# Patient Record
Sex: Female | Born: 2000 | Race: Black or African American | Hispanic: No | Marital: Single | Smoking: Never smoker
Health system: Southern US, Community
[De-identification: ages and names within clinical notes are randomized; demographics above are authoritative.]

## PROBLEM LIST (undated history)

## (undated) DIAGNOSIS — I639 Cerebral infarction, unspecified: Secondary | ICD-10-CM

## (undated) DIAGNOSIS — E7800 Pure hypercholesterolemia, unspecified: Secondary | ICD-10-CM

## (undated) HISTORY — DX: Cerebral infarction, unspecified: I63.9

## (undated) HISTORY — DX: Pure hypercholesterolemia, unspecified: E78.00

---
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
## 2021-04-04 ENCOUNTER — Other Ambulatory Visit: Payer: Self-pay

## 2021-04-04 ENCOUNTER — Ambulatory Visit
Admission: EM | Admit: 2021-04-04 | Discharge: 2021-04-04 | Disposition: A | Discharge status: Home / Self Care | Payer: BC Managed Care – PPO

## 2021-04-04 ENCOUNTER — Encounter: Payer: Self-pay | Admitting: Emergency Medicine

## 2021-04-04 DIAGNOSIS — K5900 Constipation, unspecified: Secondary | ICD-10-CM

## 2021-04-04 DIAGNOSIS — R1084 Generalized abdominal pain: Secondary | ICD-10-CM

## 2021-04-04 DIAGNOSIS — K921 Melena: Secondary | ICD-10-CM

## 2021-04-04 MED ORDER — DOCUSATE SODIUM 100 MG PO CAPS: 100.0000 mg | ORAL_CAPSULE | Freq: Two times a day (BID) | ORAL | 0 refills | Status: DC

## 2021-04-04 MED ORDER — POLYETHYLENE GLYCOL 3350 17 G PO PACK: 17.0000 g | PACK | Freq: Every day | ORAL | 0 refills | Status: DC | PRN

## 2021-04-04 MED ORDER — FLEET ENEMA 7-19 GM/118ML RE ENEM: 1.0000 | ENEMA | Freq: Every day | RECTAL | 5 refills | Status: DC | PRN

## 2021-04-04 NOTE — ED Provider Notes (Signed)
[facility]   MRN: [id number] DOB: Oct 05, 2000  Subjective:   Melanie Bowman is a 20 y.o. female presenting for 5 day history of constant mild-moderate bilateral flank/abdominal pain. Characterizes her pain as sharp, pressure type sensation. Feels that her pain is "heavy". She has had some intermittent nausea after eating. Reports chronic constipation, intermittent light blood in her stools. Had difficulty with this as a child but recently has come back. She tried a laxative Friday and had a bowel movement but has not had one again since then.  Patient's mother was very concerned because just after taking the laxative she had severe abdominal pain that ended up resolving after she had a bowel movement.  Denies fever, dysuria, hematuria, urinary frequency, vomiting. Patient does not have concerns for pregnancy, reports that she is not sexually active.  No family history of colon cancer in her immediate family.  She actually has an appointment with her PCP in 2 days but wanted to get a consultation today as well.  No current facility-administered medications for this encounter.  Current Outpatient Medications:    atomoxetine (STRATTERA) 25 MG capsule, Take 25 mg by mouth daily., Disp: , Rfl:    FLUoxetine (PROZAC) 20 MG capsule, Take 20 mg by mouth daily., Disp: , Rfl:    levonorgestrel-ethinyl estradiol (SEASONALE) 0.15-0.03 MG tablet, Take 1 tablet by mouth daily., Disp: , Rfl:    No Known Allergies  Past Medical History:  Diagnosis Date   Elevated cholesterol    Stroke (HCC) 10/03/2001   3 days after birth - per mother     History reviewed. No pertinent surgical history.  Family History  Problem Relation Age of Onset   Heart disease Other    Stroke Other    Hyperlipidemia Other    Hypertension Other    Asthma Other     Social History   Tobacco Use   Smoking status: Never   Smokeless tobacco: Never    ROS   Objective:   Vitals: BP 124/77 (BP Location: Left Arm)    Pulse 86   Temp 98.4 F (36.9 C) (Oral)   Resp 16   SpO2 98%   Physical Exam Constitutional:      General: She is not in acute distress.    Appearance: Normal appearance. She is well-developed and normal weight. She is not ill-appearing, toxic-appearing or diaphoretic.  HENT:     Head: Normocephalic and atraumatic.     Right Ear: External ear normal.     Left Ear: External ear normal.     Nose: Nose normal.     Mouth/Throat:     Mouth: Mucous membranes are moist.     Pharynx: Oropharynx is clear.  Eyes:     General: No scleral icterus.       Right eye: No discharge.        Left eye: No discharge.     Extraocular Movements: Extraocular movements intact.     Conjunctiva/sclera: Conjunctivae normal.     Pupils: Pupils are equal, round, and reactive to light.  Cardiovascular:     Rate and Rhythm: Normal rate and regular rhythm.     Pulses: Normal pulses.     Heart sounds: Normal heart sounds. No murmur heard.   No friction rub. No gallop.  Pulmonary:     Effort: Pulmonary effort is normal. No respiratory distress.     Breath sounds: Normal breath sounds. No stridor. No wheezing, rhonchi or rales.  Abdominal:     General:  Bowel sounds are normal. There is no distension.     Palpations: Abdomen is soft. There is no mass.     Tenderness: There is generalized abdominal tenderness (mild). There is no right CVA tenderness, left CVA tenderness, guarding or rebound.  Skin:    General: Skin is warm and dry.     Coloration: Skin is not pale.     Findings: No rash.  Neurological:     General: No focal deficit present.     Mental Status: She is alert and oriented to person, place, and time.  Psychiatric:        Mood and Affect: Mood normal.        Behavior: Behavior normal.        Thought Content: Thought content normal.        Judgment: Judgment normal.    Assessment and Plan :   PDMP not reviewed this encounter.  1. Constipation, unspecified constipation type   2. Blood  in stool   3. Generalized abdominal pain     Acute on chronic constipation likely induced by her recent major stressors.  Recommended daily use of stool softener, use of a laxative or enema for severe constipation.  I do not see any signs of an acute abdomen, obstruction.  Will defer ER visit.  She does have a follow-up appointment with her PCP in 2 days.  Provided her with a referral to a GI specialist given intermittent blood in her stools. Counseled patient on potential for adverse effects with medications prescribed/recommended today, ER and return-to-clinic precautions discussed, patient verbalized understanding.    Wallis Bamberg, [place] 04/04/21 [id number]

## 2021-04-04 NOTE — ED Triage Notes (Signed)
Was constipated Friday, took a laxative Saturday, had a bowel movement. Hasn't had any bowel movement since Sunday. Mother states she had been having night sweats before the onset of symptoms. Now c/o bilateral abdominal pain. Nausea with eating. Denies vomiting/fever.

## 2021-05-23 ENCOUNTER — Encounter: Payer: Self-pay | Admitting: Emergency Medicine

## 2021-05-23 ENCOUNTER — Other Ambulatory Visit: Payer: Self-pay

## 2021-05-23 ENCOUNTER — Ambulatory Visit
Admission: EM | Admit: 2021-05-23 | Discharge: 2021-05-23 | Disposition: A | Discharge status: Home / Self Care | Payer: BC Managed Care – PPO | Attending: Internal Medicine | Admitting: Internal Medicine

## 2021-05-23 DIAGNOSIS — J029 Acute pharyngitis, unspecified: Secondary | ICD-10-CM | POA: Diagnosis present

## 2021-05-23 DIAGNOSIS — J069 Acute upper respiratory infection, unspecified: Secondary | ICD-10-CM | POA: Diagnosis not present

## 2021-05-23 LAB — POCT RAPID STREP A (OFFICE): Rapid Strep A Screen: NEGATIVE

## 2021-05-23 LAB — POCT MONO SCREEN (KUC): Mono, POC: NEGATIVE

## 2021-05-23 NOTE — Discharge Instructions (Signed)
Rapid strep test and rapid monotest were negative.  Throat culture and COVID-19 viral swab are pending.  We will call if they are positive.  It appears that you have a viral infection that is causing symptoms.  You may try Cepacol throat lozenges over-the-counter.

## 2021-05-23 NOTE — ED Provider Notes (Signed)
[facility]    CSN: [id number] Arrival date & time: 05/23/21  1628      History   Chief Complaint Chief Complaint  Patient presents with   Sore Throat    HPI Melanie Bowman is a 20 y.o. female.   Patient presents with sore throat, mild runny nose, mild nonproductive cough that has been present for approximately 5 days.  Denies any known fevers or sick contacts.  Denies chest pain, shortness of breath, nausea, vomiting, diarrhea, abdominal pain.  Has taken Tylenol for symptoms.   Sore Throat   Past Medical History:  Diagnosis Date   Elevated cholesterol    Stroke (HCC) Jan 05, 2002   3 days after birth - per mother    There are no problems to display for this patient.   History reviewed. No pertinent surgical history.  OB History   No obstetric history on file.      Home Medications    Prior to Admission medications   Medication Sig Start Date End Date Taking? Authorizing Provider  atomoxetine (STRATTERA) 25 MG capsule Take 25 mg by mouth daily.    [provider]  docusate sodium (COLACE) 100 MG capsule Take 1 capsule (100 mg total) by mouth every 12 (twelve) hours. 04/04/21   Wallis Bamberg, PA-C  FLUoxetine (PROZAC) 20 MG capsule Take 20 mg by mouth daily.    [provider]  levonorgestrel-ethinyl estradiol (SEASONALE) 0.15-0.03 MG tablet Take 1 tablet by mouth daily.    [provider]  polyethylene glycol (MIRALAX) 17 g packet Take 17 g by mouth daily as needed for moderate constipation or severe constipation. 04/04/21   Wallis Bamberg, PA-C  sodium phosphate (FLEET) 7-19 GM/118ML ENEM Place 133 mLs (1 enema total) rectally daily as needed for severe constipation. 04/04/21   Wallis Bamberg, PA-C    Family History Family History  Problem Relation Age of Onset   Heart disease Other    Stroke Other    Hyperlipidemia Other    Hypertension Other    Asthma Other     Social History Social History   Tobacco Use   Smoking status:  Never   Smokeless tobacco: Never     Allergies   Patient has no known allergies.   Review of Systems Review of Systems Per HPI  Physical Exam Triage Vital Signs ED Triage Vitals [05/23/21 1737]  Enc Vitals Group     BP 121/71     Pulse Rate (!) 111     Resp 16     Temp 98.2 F (36.8 C)     Temp Source Oral     SpO2 99 %     Weight      Height      Head Circumference      Peak Flow      Pain Score 4     Pain Loc      Pain Edu?      Excl. in GC?    No data found.  Updated Vital Signs BP 121/71 (BP Location: Left Arm)   Pulse (!) 111   Temp 98.2 F (36.8 C) (Oral)   Resp 16   SpO2 99%   Visual Acuity Right Eye Distance:   Left Eye Distance:   Bilateral Distance:    Right Eye Near:   Left Eye Near:    Bilateral Near:     Physical Exam Constitutional:      General: She is not in acute distress.    Appearance: Normal  appearance. She is not toxic-appearing or diaphoretic.  HENT:     Head: Normocephalic and atraumatic.     Right Ear: Tympanic membrane and ear canal normal.     Left Ear: Tympanic membrane and ear canal normal.     Nose: Congestion present.     Mouth/Throat:     Lips: Pink.     Mouth: Mucous membranes are moist.     Pharynx: Posterior oropharyngeal erythema present. No pharyngeal swelling, oropharyngeal exudate or uvula swelling.     Tonsils: No tonsillar exudate or tonsillar abscesses.  Eyes:     Extraocular Movements: Extraocular movements intact.     Conjunctiva/sclera: Conjunctivae normal.     Pupils: Pupils are equal, round, and reactive to light.  Cardiovascular:     Rate and Rhythm: Normal rate and regular rhythm.     Pulses: Normal pulses.     Heart sounds: Normal heart sounds.  Pulmonary:     Effort: Pulmonary effort is normal. No respiratory distress.     Breath sounds: Normal breath sounds. No wheezing.  Abdominal:     General: Abdomen is flat. Bowel sounds are normal.     Palpations: Abdomen is soft.  Musculoskeletal:         General: Normal range of motion.     Cervical back: Normal range of motion.  Skin:    General: Skin is warm and dry.  Neurological:     General: No focal deficit present.     Mental Status: She is alert and oriented to person, place, and time. Mental status is at baseline.  Psychiatric:        Mood and Affect: Mood normal.        Behavior: Behavior normal.     UC Treatments / Results  Labs (all labs ordered are listed, but only abnormal results are displayed) Labs Reviewed  CULTURE, GROUP A STREP ([facility])  NOVEL CORONAVIRUS, NAA  POCT RAPID STREP A (OFFICE)  POCT MONO SCREEN ([facility])    EKG   Radiology No results found.  Procedures Procedures (including critical care time)  Medications Ordered in UC Medications - No data to display  Initial Impression / Assessment and Plan / UC Course  I have reviewed the triage vital signs and the nursing notes.  Pertinent labs & imaging results that were available during my care of the patient were reviewed by me and considered in my medical decision making (see chart for details).     It appears that patient symptoms are viral in etiology.  Rapid strep test and rapid monotest were both negative.  Throat culture and COVID-19 viral swab are pending.  Discussed supportive care and symptom management with patient.  No red flags on exam.  Discussed return precautions.  Patient verbalized understanding and was agreeable with plan. Final Clinical Impressions(s) / UC Diagnoses   Final diagnoses:  Sore throat  Viral upper respiratory tract infection with cough     Discharge Instructions      Rapid strep test and rapid monotest were negative.  Throat culture and COVID-19 viral swab are pending.  We will call if they are positive.  It appears that you have a viral infection that is causing symptoms.  You may try Cepacol throat lozenges over-the-counter.     ED Prescriptions   None    PDMP not reviewed this encounter.    Gustavus Bryant, [place] 05/23/21 1840

## 2021-05-23 NOTE — ED Triage Notes (Signed)
Sore throat since Saturday, making her hoarse. Mild runny nose. Denies fever. Took tylenol prior to arrival

## 2021-05-24 LAB — SARS-COV-2, NAA 2 DAY TAT

## 2021-05-24 LAB — NOVEL CORONAVIRUS, NAA: SARS-CoV-2, NAA: NOT DETECTED

## 2021-05-26 LAB — CULTURE, GROUP A STREP (THRC)

## 2022-04-15 ENCOUNTER — Encounter (HOSPITAL_COMMUNITY): Payer: Self-pay

## 2022-04-15 ENCOUNTER — Other Ambulatory Visit: Payer: Self-pay

## 2022-04-15 ENCOUNTER — Emergency Department (HOSPITAL_COMMUNITY)
Admission: EM | Admit: 2022-04-15 | Discharge: 2022-04-15 | Disposition: A | Discharge status: Home / Self Care | Payer: Managed Care, Other (non HMO) | Attending: Emergency Medicine | Admitting: Emergency Medicine

## 2022-04-15 DIAGNOSIS — N9489 Other specified conditions associated with female genital organs and menstrual cycle: Secondary | ICD-10-CM | POA: Diagnosis not present

## 2022-04-15 DIAGNOSIS — R0602 Shortness of breath: Secondary | ICD-10-CM | POA: Diagnosis not present

## 2022-04-15 DIAGNOSIS — R109 Unspecified abdominal pain: Secondary | ICD-10-CM | POA: Diagnosis not present

## 2022-04-15 DIAGNOSIS — Z20822 Contact with and (suspected) exposure to covid-19: Secondary | ICD-10-CM | POA: Diagnosis not present

## 2022-04-15 DIAGNOSIS — R42 Dizziness and giddiness: Secondary | ICD-10-CM | POA: Diagnosis present

## 2022-04-15 DIAGNOSIS — R5383 Other fatigue: Secondary | ICD-10-CM | POA: Insufficient documentation

## 2022-04-15 LAB — CBC WITH DIFFERENTIAL/PLATELET
Abs Immature Granulocytes: 0.04 10*3/uL (ref 0.00–0.07)
Basophils Absolute: 0.1 10*3/uL (ref 0.0–0.1)
Basophils Relative: 1 %
Eosinophils Absolute: 0.2 10*3/uL (ref 0.0–0.5)
Eosinophils Relative: 1 %
HCT: 41.7 % (ref 36.0–46.0)
Hemoglobin: 13.2 g/dL (ref 12.0–15.0)
Immature Granulocytes: 0 %
Lymphocytes Relative: 15 %
Lymphs Abs: 2.1 10*3/uL (ref 0.7–4.0)
MCH: 26.9 pg (ref 26.0–34.0)
MCHC: 31.7 g/dL (ref 30.0–36.0)
MCV: 84.9 fL (ref 80.0–100.0)
Monocytes Absolute: 0.8 10*3/uL (ref 0.1–1.0)
Monocytes Relative: 6 %
Neutro Abs: 10.6 10*3/uL — ABNORMAL HIGH (ref 1.7–7.7)
Neutrophils Relative %: 77 %
Platelets: 324 10*3/uL (ref 150–400)
RBC: 4.91 MIL/uL (ref 3.87–5.11)
RDW: 15.5 % (ref 11.5–15.5)
WBC: 13.8 10*3/uL — ABNORMAL HIGH (ref 4.0–10.5)
nRBC: 0 % (ref 0.0–0.2)

## 2022-04-15 LAB — URINALYSIS, ROUTINE W REFLEX MICROSCOPIC
Bilirubin Urine: NEGATIVE
Glucose, UA: NEGATIVE mg/dL
Ketones, ur: NEGATIVE mg/dL
Leukocytes,Ua: NEGATIVE
Nitrite: NEGATIVE
Protein, ur: 30 mg/dL — AB
Specific Gravity, Urine: 1.025 (ref 1.005–1.030)
pH: 5 (ref 5.0–8.0)

## 2022-04-15 LAB — LIPASE, BLOOD: Lipase: 31 U/L (ref 11–51)

## 2022-04-15 LAB — COMPREHENSIVE METABOLIC PANEL WITH GFR
ALT: 12 U/L (ref 0–44)
AST: 23 U/L (ref 15–41)
Albumin: 3.6 g/dL (ref 3.5–5.0)
Alkaline Phosphatase: 33 U/L — ABNORMAL LOW (ref 38–126)
Anion gap: 9 (ref 5–15)
BUN: 7 mg/dL (ref 6–20)
CO2: 22 mmol/L (ref 22–32)
Calcium: 9.1 mg/dL (ref 8.9–10.3)
Chloride: 106 mmol/L (ref 98–111)
Creatinine, Ser: 0.72 mg/dL (ref 0.44–1.00)
GFR, Estimated: >60 mL/min
Glucose, Bld: 78 mg/dL (ref 70–99)
Potassium: 3.9 mmol/L (ref 3.5–5.1)
Sodium: 137 mmol/L (ref 135–145)
Total Bilirubin: 0.6 mg/dL (ref 0.3–1.2)
Total Protein: 7 g/dL (ref 6.5–8.1)

## 2022-04-15 LAB — RESP PANEL BY RT-PCR (FLU A&B, COVID) ARPGX2
Influenza A by PCR: NEGATIVE
Influenza B by PCR: NEGATIVE
SARS Coronavirus 2 by RT PCR: NEGATIVE

## 2022-04-15 LAB — PREGNANCY, URINE: Preg Test, Ur: NEGATIVE

## 2022-04-15 LAB — I-STAT BETA HCG BLOOD, ED (MC, WL, AP ONLY): I-stat hCG, quantitative: <5 m[IU]/mL (ref <5)

## 2022-04-15 LAB — MAGNESIUM: Magnesium: 2 mg/dL (ref 1.7–2.4)

## 2022-04-15 MED ORDER — LACTATED RINGERS IV BOLUS
1000.0000 mL | Freq: Once | INTRAVENOUS | Status: AC
  Administered 2022-04-15: 1000 mL via INTRAVENOUS

## 2022-04-15 NOTE — Discharge Instructions (Signed)
Today you were seen in the emergency department for your lightheadedness.    In the emergency department you had lab work you were given IV fluids that was reassuring.    At home, please stay well-hydrated.    Follow-up with your primary doctor in 2-3 days regarding your visit.    Return immediately to the emergency department if you experience any of the following: Fainting, chest pain, shortness of breath, or any other concerning symptoms.    Thank you for visiting our Emergency Department. It was a pleasure taking care of you today.

## 2022-04-15 NOTE — ED Notes (Signed)
Pt c/o rash after starting IV fluids, rash noted on R forearm. IV fluids stopped. MD Philip Aspen made aware.

## 2022-04-15 NOTE — ED Provider Notes (Signed)
[facility] Provider [facility]   CSN: [id number] Arrival date & time: 04/15/22  7858     History  No chief complaint on file.   Melanie Bowman is a 21 y.o. female.  21 year old female with a history of generalized anxiety disorder, ADD, mild depression, and IBS who presents to the emergency department with fatigue and lightheadedness.  Patient is accompanied by her mother and sister who help provide additional history.  They stated for several months she has been feeling off and has been less active than usual.  Says that it started sometime around when she started school again and she reports being under significant amount of stress.  Says that she has been to several specialists who have left that thyroid and pituitary function as well as a gastroenterologist.  Reports that today she was going to get lunch and started to feel very weak.  Says that she had mild shortness of breath and felt that she was going to pass out so went to sit down.  Says that she had mild abdominal pain at that time.  911 was called and when EMS arrived they noted her to have orthostatic hypotension.  Denies any fever, cough, nausea, vomiting, or severe diarrhea.  Says that she had this happen several years ago but not since.      Home Medications Prior to Admission medications   Medication Sig Start Date End Date Taking? Authorizing Provider  escitalopram (LEXAPRO) 10 MG tablet Take 10 mg by mouth at bedtime.   Yes [provider]  ibuprofen (ADVIL) 200 MG tablet Take 400 mg by mouth 2 (two) times daily as needed for headache, mild pain or cramping.   Yes [provider]  norethindrone-ethinyl estradiol-FE (LOESTRIN FE) 1-20 MG-MCG tablet Take 1 tablet by mouth at bedtime.   Yes [provider]      Allergies    Patient has no known allergies.    Review of Systems   Review of Systems  Physical Exam Updated Vital Signs BP 115/72   Pulse 85    Temp 98.8 F (37.1 C) (Oral)   Resp (!) 22   SpO2 100%  Physical Exam Vitals and nursing note reviewed.  Constitutional:      General: She is not in acute distress.    Appearance: She is well-developed.  HENT:     Head: Normocephalic and atraumatic.     Right Ear: External ear normal.     Left Ear: External ear normal.     Nose: Nose normal.  Eyes:     Extraocular Movements: Extraocular movements intact.     Conjunctiva/sclera: Conjunctivae normal.     Pupils: Pupils are equal, round, and reactive to light.  Cardiovascular:     Rate and Rhythm: Normal rate and regular rhythm.     Heart sounds: No murmur heard. Pulmonary:     Effort: Pulmonary effort is normal. No respiratory distress.     Breath sounds: Normal breath sounds.  Abdominal:     General: Abdomen is flat. There is no distension.     Palpations: Abdomen is soft. There is no mass.     Tenderness: There is no abdominal tenderness. There is no guarding.  Musculoskeletal:        General: No swelling.     Cervical back: Normal range of motion and neck supple.     Right lower leg: No edema.     Left lower leg: No edema.  Skin:  General: Skin is warm and dry.     Capillary Refill: Capillary refill takes 2 to 3 seconds.  Neurological:     Mental Status: She is alert and oriented to person, place, and time. Mental status is at baseline.  Psychiatric:        Mood and Affect: Mood normal.     ED Results / Procedures / Treatments   Labs (all labs ordered are listed, but only abnormal results are displayed) Labs Reviewed  CBC WITH DIFFERENTIAL/PLATELET - Abnormal; Notable for the following components:      Result Value   WBC 13.8 (*)    Neutro Abs 10.6 (*)    All other components within normal limits  URINALYSIS, ROUTINE W REFLEX MICROSCOPIC - Abnormal; Notable for the following components:   Color, Urine AMBER (*)    APPearance HAZY (*)    Hgb urine dipstick SMALL (*)    Protein, ur 30 (*)    Bacteria, UA MANY  (*)    All other components within normal limits  COMPREHENSIVE METABOLIC PANEL - Abnormal; Notable for the following components:   Alkaline Phosphatase 33 (*)    All other components within normal limits  RESP PANEL BY RT-PCR (FLU A&B, COVID) ARPGX2  PREGNANCY, URINE  LIPASE, BLOOD  MAGNESIUM  I-STAT BETA HCG BLOOD, ED ([facility], [facility], [facility] ONLY)    EKG EKG Interpretation  Date/Time:  Monday April 15 2022 13:09:00 EDT Ventricular Rate:  61 PR Interval:  137 QRS Duration: 72 QT Interval:  418 QTC Calculation: 421 R Axis:   71 Text Interpretation: Sinus rhythm Confirmed by Margaretmary Eddy [phone]) on 04/15/2022 1:10:45 PM  Radiology No results found.  Procedures Procedures   Medications Ordered in ED Medications  lactated ringers bolus 1,000 mL (0 mLs Intravenous Stopped 04/15/22 1402)    ED Course/ Medical Decision Making/ A&P                           Medical Decision Making Amount and/or Complexity of Data Reviewed Labs: ordered.   Melanie Bowman is a 21 y.o. female with comorbidities that complicate the patient evaluation including generalized anxiety disorder, ADD, mild depression, and IBS who presents to the emergency department with fatigue and lightheadedness.   This patient presents to the ED for concern of complaints listed in HPI, this involves an extensive number of treatment options, and is a complaint that carries with it a high risk of complications and morbidity.   Initial Ddx:  Orthostatic hypotension, panic attack/anxiety, arrhythmia, thyroid disorder, electrolyte abnormality  MDM:  Feel the patient likely had an episode of lightheadedness due to hypovolemia and orthostatic hypotension.  Feel that he could be a component of anxiety at play with it as well.  However, given the patient's lightheadedness will work-up for arrhythmia and check electrolytes and EKG.  Considered thyroid disorder as being related to this but since she has had a prior work-up do not  feel that repeating labs at this time is warranted.  Very benign abdominal exam at this time do not feel that she has a surgical intra-abdominal process at play.  Plan:  Labs EKG Fluids  ED Summary:  Labs not reveal any acute abnormality.  EKG without concerning findings for arrhythmia.  Patient was given fluids and was feeling much better.  Of note she did have a mild reaction in her left forearm which resolved spontaneously or she had small amount of urticaria on her proximal arm  near the IV infusion site.  Did not develop any other concerning signs for an allergic reaction.  Patient did have a contaminated urine sample but denied any symptoms of an STI or UTI so we will withhold treatment at this time.  Instructed that she should follow-up with her primary doctor as well as school counselor regarding her symptoms.  Dispo: DC Home. Return precautions discussed including, but not limited to, those listed in the AVS. Allowed pt time to ask questions which were answered fully prior to dc.   Additional history obtained from mother Records reviewed Care Everywhere The following labs were independently interpreted: Chemistry and show  no acute findings I personally reviewed and interpreted cardiac monitoring: normal sinus rhythm  I personally reviewed and interpreted the pt's EKG: see above for interpretation  I have reviewed the patients home medications and made adjustments as needed  Final Clinical Impression(s) / ED Diagnoses Final diagnoses:  Light headedness    Rx / DC Orders ED Discharge Orders     None         Fransico Meadow, MD 04/15/22 1844

## 2022-04-15 NOTE — ED Triage Notes (Signed)
Patient arrived by [facility] with complaint of nausea last night and this am while awaiting breakfast became hot with recurrent nausea and abdominal pain. Alert and oriented

## 2022-04-15 NOTE — ED Notes (Signed)
Rash no longer present on R arm.

## 2022-04-15 NOTE — ED Provider Triage Note (Signed)
Emergency Medicine Provider Triage Evaluation Note  Melanie Bowman , a 21 y.o. female  was evaluated in triage.  Pt complains of nausea and presyncope.  She admits to nausea last night. No vomiting. Woke up this morning and felt like she was going to pass out. No syncopal episodes. Admits to some lower abdominal pain.  No vaginal discharge.  No chest pain or shortness of breath.  Finished her menstrual cycle last week.  Review of Systems  Positive: nausea Negative: fever  Physical Exam  BP 107/69 (BP Location: Right Arm)   Pulse 84   Temp 98.5 F (36.9 C) (Oral)   Resp 16   SpO2 100%  Gen:   Awake, no distress   Resp:  Normal effort  MSK:   Moves extremities without difficulty  Other:    Medical Decision Making  Medically screening exam initiated at 8:27 AM.  Appropriate orders placed.  Melanie Bowman was informed that the remainder of the evaluation will be completed by another provider, this initial triage assessment does not replace that evaluation, and the importance of remaining in the ED until their evaluation is complete.  Labs COVID   Melanie Bowman, [place] 04/15/22 [phone]
